# Patient Record
Sex: Female | Born: 1960 | Race: White | Marital: Married | Smoking: Former smoker
Health system: Southern US, Community
[De-identification: ages and names within clinical notes are randomized; demographics above are authoritative.]

## PROBLEM LIST (undated history)

## (undated) DIAGNOSIS — G40909 Epilepsy, unspecified, not intractable, without status epilepticus: Secondary | ICD-10-CM

## (undated) DIAGNOSIS — C55 Malignant neoplasm of uterus, part unspecified: Secondary | ICD-10-CM

## (undated) DIAGNOSIS — I671 Cerebral aneurysm, nonruptured: Secondary | ICD-10-CM

## (undated) DIAGNOSIS — C8590 Non-Hodgkin lymphoma, unspecified, unspecified site: Secondary | ICD-10-CM

## (undated) DIAGNOSIS — K469 Unspecified abdominal hernia without obstruction or gangrene: Secondary | ICD-10-CM

## (undated) DIAGNOSIS — M810 Age-related osteoporosis without current pathological fracture: Secondary | ICD-10-CM

## (undated) DIAGNOSIS — C50919 Malignant neoplasm of unspecified site of unspecified female breast: Secondary | ICD-10-CM

## (undated) HISTORY — DX: Epilepsy, unspecified, not intractable, without status epilepticus: G40.909

## (undated) HISTORY — DX: Age-related osteoporosis without current pathological fracture: M81.0

## (undated) HISTORY — DX: Non-Hodgkin lymphoma, unspecified, unspecified site: C85.90

## (undated) HISTORY — PX: CEREBRAL ANEURYSM REPAIR: SHX164

## (undated) HISTORY — DX: Malignant neoplasm of unspecified site of unspecified female breast: C50.919

## (undated) HISTORY — PX: ABDOMINAL HERNIA REPAIR: SHX539

## (undated) HISTORY — PX: OTHER SURGICAL HISTORY: SHX169

## (undated) HISTORY — PX: HIP SURGERY: SHX245

## (undated) HISTORY — DX: Malignant neoplasm of uterus, part unspecified: C55

## (undated) HISTORY — DX: Cerebral aneurysm, nonruptured: I67.1

## (undated) HISTORY — DX: Unspecified abdominal hernia without obstruction or gangrene: K46.9

## (undated) HISTORY — PX: BREAST ENHANCEMENT SURGERY: SHX7

---

## 2014-10-19 HISTORY — PX: CRANIOTOMY: SHX93

## 2018-01-19 ENCOUNTER — Encounter: Payer: Self-pay | Admitting: Gastroenterology

## 2018-01-24 ENCOUNTER — Ambulatory Visit (INDEPENDENT_AMBULATORY_CARE_PROVIDER_SITE_OTHER): Payer: BLUE CROSS/BLUE SHIELD | Admitting: Gastroenterology

## 2018-01-24 ENCOUNTER — Encounter: Payer: Self-pay | Admitting: Gastroenterology

## 2018-01-24 ENCOUNTER — Other Ambulatory Visit (INDEPENDENT_AMBULATORY_CARE_PROVIDER_SITE_OTHER): Payer: BLUE CROSS/BLUE SHIELD

## 2018-01-24 VITALS — Ht 64.5 in | Wt 174.4 lb

## 2018-01-24 DIAGNOSIS — R634 Abnormal weight loss: Secondary | ICD-10-CM

## 2018-01-24 DIAGNOSIS — R6881 Early satiety: Secondary | ICD-10-CM

## 2018-01-24 DIAGNOSIS — R197 Diarrhea, unspecified: Secondary | ICD-10-CM

## 2018-01-24 LAB — CBC WITH DIFFERENTIAL/PLATELET
Basophils Absolute: 0 10*3/uL (ref 0.0–0.1)
Basophils Relative: 0.4 % (ref 0.0–3.0)
EOS PCT: 1.8 % (ref 0.0–5.0)
Eosinophils Absolute: 0.1 10*3/uL (ref 0.0–0.7)
HCT: 39 % (ref 36.0–46.0)
Hemoglobin: 13 g/dL (ref 12.0–15.0)
LYMPHS ABS: 1 10*3/uL (ref 0.7–4.0)
Lymphocytes Relative: 20.6 % (ref 12.0–46.0)
MCHC: 33.4 g/dL (ref 30.0–36.0)
MCV: 90.8 fl (ref 78.0–100.0)
MONOS PCT: 10.3 % (ref 3.0–12.0)
Monocytes Absolute: 0.5 10*3/uL (ref 0.1–1.0)
NEUTROS ABS: 3.1 10*3/uL (ref 1.4–7.7)
NEUTROS PCT: 66.9 % (ref 43.0–77.0)
PLATELETS: 242 10*3/uL (ref 150.0–400.0)
RBC: 4.3 Mil/uL (ref 3.87–5.11)
RDW: 14.2 % (ref 11.5–15.5)
WBC: 4.6 10*3/uL (ref 4.0–10.5)

## 2018-01-24 LAB — COMPREHENSIVE METABOLIC PANEL
ALK PHOS: 83 U/L (ref 39–117)
ALT: 15 U/L (ref 0–35)
AST: 18 U/L (ref 0–37)
Albumin: 4.2 g/dL (ref 3.5–5.2)
BILIRUBIN TOTAL: 0.5 mg/dL (ref 0.2–1.2)
BUN: 21 mg/dL (ref 6–23)
CO2: 23 mEq/L (ref 19–32)
Calcium: 9.1 mg/dL (ref 8.4–10.5)
Chloride: 106 mEq/L (ref 96–112)
Creatinine, Ser: 0.65 mg/dL (ref 0.40–1.20)
GFR: 100.45 mL/min (ref >60.00)
GLUCOSE: 88 mg/dL (ref 70–99)
POTASSIUM: 4.3 meq/L (ref 3.5–5.1)
SODIUM: 138 meq/L (ref 135–145)
TOTAL PROTEIN: 7.5 g/dL (ref 6.0–8.3)

## 2018-01-24 NOTE — Patient Instructions (Addendum)
We will get records sent from your previous gastroenterologist in [place] for review.  This will include any endoscopic (colonoscopy or upper endoscopy) procedures and any associated pathology reports.   Please stop the linzess for now.  Can resume it every other day if constipation returns. You will be set up for an upper endoscopy for early satiety, weight loss. You will have labs checked today in the basement lab.  Please head down after you check out with the front desk  (cbc, cmet). Normal BMI (Body Mass Index- based on height and weight) is between 23 and 30. Your BMI today is Body mass index is 29.47 kg/m. Marland Kitchen Please consider follow up  regarding your BMI with your Primary Care Provider.

## 2018-01-24 NOTE — Progress Notes (Signed)
HPI: This is a  very pleasant 57 year old woman who was self referred.  Chief complaint is early satiety, intermittent explosive diarrhea, weight loss    She has brain cancer, GBM Stage IV, diagnosed in 2016. S/p surgery then chemo/XRT for about a year. She knows it is not cancer free.  Last chemo/XRT was about a year ago.  She is followed at [facility], MRI brain every other month.  She is having problems with her stomach;  She is eating poorly, early satiety. Feels bloated.  Losing weight 18 pounds in 2 months.  No post prandial pains, but a lot of bloating after eating.  Diarrhea in the AM, this is explosive.  Has been on linzess for about a year.  Used to be on miralax and stool softner as well.   Fiber supplements didn't help, previously.  When she was 17 she had bleeding ulcers.  She takes baby ASA (right side brain stents), was on plavix for 6 months previously.   Old Data Reviewed: Colonoscopy, Dr. Elenor Legato, April 2018 in [place].  Indication "anemia, history of colon polyps".  Findings hemorrhoids, diverticulosis in the sigmoid, 2 diminutive polyps were biopsied at 35 cm.  We do not have any pathology results at the time of this visit   Review of systems: Pertinent positive and negative review of systems were noted in the above HPI section. All other review negative.    History reviewed. No pertinent surgical history.  Current Outpatient Medications  Medication Sig Dispense Refill  . ALPRAZolam (XANAX) 1 MG tablet Take 1 mg by mouth at bedtime as needed for anxiety.    Marland Kitchen aspirin EC 81 MG tablet Take 81 mg by mouth daily.    . baclofen (LIORESAL) 10 MG tablet Take 10 mg by mouth 2 (two) times daily as needed for muscle spasms.    . butalbital-acetaminophen-caffeine (FIORICET WITH CODEINE) 50-325-40-30 MG capsule Take 1 capsule by mouth every 4 (four) hours as needed for headache.    . calcium carbonate (OSCAL) 1500 (600 Ca) MG TABS tablet Take by mouth daily.    Marland Kitchen  levETIRAcetam (KEPPRA) 1000 MG tablet Take 1,000 mg by mouth 2 (two) times daily.    Marland Kitchen linaclotide (LINZESS) 290 MCG CAPS capsule Take 290 mcg by mouth daily before breakfast.    . oxycodone (OXY-IR) 5 MG capsule Take 5 mg by mouth 2 (two) times daily.    Marland Kitchen topiramate (TOPAMAX) 25 MG capsule Take 25 mg by mouth 4 (four) times daily.    . traZODone (DESYREL) 50 MG tablet Take 100 mg by mouth at bedtime.     No current facility-administered medications for this visit.     Allergies as of 01/24/2018  . (No Known Allergies)    Family History  Problem Relation Age of Onset  . Colon cancer Father   . Prostate cancer Father   . Diabetes Father   . Lung cancer Maternal Grandmother   . Cancer Maternal Grandfather   . Cancer Paternal Grandfather     Social History   Socioeconomic History  . Marital status: Married    Spouse name: Not on file  . Number of children: 0  . Years of education: Not on file  . Highest education level: Not on file  Occupational History  . Occupation: Disabled  Social Needs  . Financial resource strain: Not on file  . Food insecurity:    Worry: Not on file    Inability: Not on file  . Transportation needs:  Medical: Not on file    Non-medical: Not on file  Tobacco Use  . Smoking status: Former Smoker    Last attempt to quit: 2006    Years since quitting: 13.2  . Smokeless tobacco: Never Used  Substance and Sexual Activity  . Alcohol use: Yes    Alcohol/week: 1.2 oz    Types: 1 Glasses of wine, 1 Shots of liquor per week    Comment: every evening  . Drug use: Never  . Sexual activity: Not on file  Lifestyle  . Physical activity:    Days per week: Not on file    Minutes per session: Not on file  . Stress: Not on file  Relationships  . Social connections:    Talks on phone: Not on file    Gets together: Not on file    Attends religious service: Not on file    Active member of club or organization: Not on file    Attends meetings of clubs  or organizations: Not on file    Relationship status: Not on file  . Intimate partner violence:    Fear of current or ex partner: Not on file    Emotionally abused: Not on file    Physically abused: Not on file    Forced sexual activity: Not on file  Other Topics Concern  . Not on file  Social History Narrative  . Not on file     Physical Exam: Ht 5' 4.5" (1.638 m)   Wt 174 lb 6 oz (79.1 kg)   BMI 29.47 kg/m  Constitutional: generally well-appearing, she slurs her words somewhat Psychiatric: alert and oriented x3 Eyes: extraocular movements intact Mouth: oral pharynx moist, no lesions Neck: supple no lymphadenopathy Cardiovascular: heart regular rate and rhythm Lungs: clear to auscultation bilaterally Abdomen: soft, nontender, nondistended, no obvious ascites, no peritoneal signs, normal bowel sounds Extremities: no lower extremity edema bilaterally Skin: no lesions on visible extremities   Assessment and plan: 58 y.o. female with personal history of uterine cancer, personal history of neck lymphoma, personal history of stage IV brain cancer, early satiety, weight loss  First she is having explosive diarrhea every morning after she wakes up and eats her breakfast meal.  She is taking Linzess on a daily basis.  She used to require quite a bit more to get her bowels moving when she was very constipated around the time of some chemotherapy treatments.  I recommended she stop the Linzess and see if she even needs that anymore.  Perhaps she will just need on an every other day or as needed basis only.   Second she is  Losing weight and having early satiety with bloating after meals.  I am going to get a basic set of labs checked today including a CBC and complete metabolic profile and we will arrange for an upper endoscopy to be performed at her soonest convenience    Please see the "Patient Instructions" section for addition details about the plan.   Owens Loffler, MD [facility] [place] 01/24/2018, 9:10 AM

## 2018-01-26 ENCOUNTER — Telehealth: Payer: Self-pay | Admitting: Gastroenterology

## 2018-01-26 NOTE — Telephone Encounter (Signed)
Colonoscopy, April 2018, Dr. Karleen Hampshire in [place].  Indication "anemia" also "history of colon polyps" findings hemorrhoids, diverticulosis, colon polyps "to 2-3 mm polyps biopsied 35 cm" pathology showed hyperplastic polyps   Colonoscopy, Dr. Earley Brooke, March 2014, indications "family history of colonic neoplasm".  Findings hemorrhoids, tortuous colon, colon polyps "2 papular polyps removed from the rectum using a cold biopsy forceps", adherent colon.  Pathology, hyperplastic polyps of the rectum.

## 2018-01-28 ENCOUNTER — Encounter: Payer: Self-pay | Admitting: Gastroenterology

## 2018-01-28 ENCOUNTER — Ambulatory Visit (AMBULATORY_SURGERY_CENTER): Payer: BLUE CROSS/BLUE SHIELD | Admitting: Gastroenterology

## 2018-01-28 ENCOUNTER — Other Ambulatory Visit: Payer: Self-pay

## 2018-01-28 VITALS — BP 123/72 | HR 60 | Temp 97.7°F | Resp 15 | Ht 64.5 in | Wt 174.0 lb

## 2018-01-28 DIAGNOSIS — R6881 Early satiety: Secondary | ICD-10-CM

## 2018-01-28 DIAGNOSIS — K2950 Unspecified chronic gastritis without bleeding: Secondary | ICD-10-CM | POA: Diagnosis not present

## 2018-01-28 DIAGNOSIS — K5900 Constipation, unspecified: Secondary | ICD-10-CM

## 2018-01-28 DIAGNOSIS — K2900 Acute gastritis without bleeding: Secondary | ICD-10-CM

## 2018-01-28 MED ORDER — SODIUM CHLORIDE 0.9 % IV SOLN: 500.0000 mL | Freq: Once | INTRAVENOUS | Status: AC

## 2018-01-28 MED ORDER — LINACLOTIDE 72 MCG PO CAPS: 72.0000 ug | ORAL_CAPSULE | Freq: Every day | ORAL | 11 refills | Status: AC

## 2018-01-28 NOTE — Progress Notes (Signed)
Called to room to assist during endoscopic procedure.  Patient ID and intended procedure confirmed with present staff. Received instructions for my participation in the procedure from the performing physician.  

## 2018-01-28 NOTE — Progress Notes (Signed)
Report to PACU, RN, vss, BBS= Clear.  

## 2018-01-28 NOTE — Op Note (Signed)
[facility] Patient Name: Beverly Bates Procedure Date: 01/28/2018 7:49 AM MRN: [id number] Endoscopist: Milus Banister , MD Age: 57 Referring MD:  Date of Birth: [birth year] Gender: Female Account #: [id number] Procedure:                Upper GI endoscopy Indications:              Early satiety, Weight loss; Stage IV GBM Medicines:                Monitored Anesthesia Care Procedure:                Pre-Anesthesia Assessment:                           - Prior to the procedure, a History and Physical                            was performed, and patient medications and                            allergies were reviewed. The patient's tolerance of                            previous anesthesia was also reviewed. The risks                            and benefits of the procedure and the sedation                            options and risks were discussed with the patient.                            All questions were answered, and informed consent                            was obtained. Prior Anticoagulants: The patient has                            taken no previous anticoagulant or antiplatelet                            agents. ASA Grade Assessment: III - A patient with                            severe systemic disease. After reviewing the risks                            and benefits, the patient was deemed in                            satisfactory condition to undergo the procedure.                           After obtaining informed consent, the endoscope was  passed under direct vision. Throughout the                            procedure, the patient's blood pressure, pulse, and                            oxygen saturations were monitored continuously. The                            Endoscope was introduced through the mouth, and                            advanced to the second part of duodenum. The upper                            GI  endoscopy was accomplished without difficulty.                            The patient tolerated the procedure well. Scope In: Scope Out: Findings:                 Mild inflammation characterized by erosions and                            erythema was found in the gastric antrum. Biopsies                            were taken with a cold forceps for histology.                           The exam was otherwise without abnormality. Complications:            No immediate complications. Estimated blood loss:                            None. Estimated Blood Loss:     Estimated blood loss: none. Impression:               - Gastritis. Biopsied to check for H. pylori.                           - The examination was otherwise normal. Recommendation:           - Patient has a contact number available for                            emergencies. The signs and symptoms of potential                            delayed complications were discussed with the                            patient. Return to normal activities tomorrow.                            Written discharge instructions  were provided to the                            patient.                           - Resume previous diet.                           - Continue present medications. Stop your current                            linzess and start lower dose pill (new script today                            20mcg, one pill once daily, disp 30 with 11                            refills).                           - Await pathology results. Will treat with                            appropriate antibiotics if H. pylori is found.                           - Your pain medicines may contribute to some of                            your GI symptoms. Milus Banister, MD 01/28/2018 8:05:07 AM This report has been signed electronically.

## 2018-01-28 NOTE — Patient Instructions (Signed)
Impression/recommendations:  Gastritis (handout given)  YOU HAD AN ENDOSCOPIC PROCEDURE TODAY AT [facility]:   Refer to the procedure report that was given to you for any specific questions about what was found during the examination.  If the procedure report does not answer your questions, please call your gastroenterologist to clarify.  If you requested that your care partner not be given the details of your procedure findings, then the procedure report has been included in a sealed envelope for you to review at your convenience later.  YOU SHOULD EXPECT: Some feelings of bloating in the abdomen. Passage of more gas than usual.  Walking can help get rid of the air that was put into your GI tract during the procedure and reduce the bloating. If you had a lower endoscopy (such as a colonoscopy or flexible sigmoidoscopy) you may notice spotting of blood in your stool or on the toilet paper. If you underwent a bowel prep for your procedure, you may not have a normal bowel movement for a few days.  Please Note:  You might notice some irritation and congestion in your nose or some drainage.  This is from the oxygen used during your procedure.  There is no need for concern and it should clear up in a day or so.  SYMPTOMS TO REPORT IMMEDIATELY:   Following upper endoscopy (EGD)  Vomiting of blood or coffee ground material  New chest pain or pain under the shoulder blades  Painful or persistently difficult swallowing  New shortness of breath  Fever of 100F or higher  Black, tarry-looking stools  For urgent or emergent issues, a gastroenterologist can be reached at any hour by calling [phone].   DIET:  We do recommend a small meal at first, but then you may proceed to your regular diet.  Drink plenty of fluids but you should avoid alcoholic beverages for 24 hours.  ACTIVITY:  You should plan to take it easy for the rest of today and you should NOT DRIVE or use heavy  machinery until tomorrow (because of the sedation medicines used during the test).    FOLLOW UP: Our staff will call the number listed on your records the next business day following your procedure to check on you and address any questions or concerns that you may have regarding the information given to you following your procedure. If we do not reach you, we will leave a message.  However, if you are feeling well and you are not experiencing any problems, there is no need to return our call.  We will assume that you have returned to your regular daily activities without incident.  If any biopsies were taken you will be contacted by phone or by letter within the next 1-3 weeks.  Please call us at [phone] if you have not heard about the biopsies in 3 weeks.    SIGNATURES/CONFIDENTIALITY: You and/or your care partner have signed paperwork which will be entered into your electronic medical record.  These signatures attest to the fact that that the information above on your After Visit Summary has been reviewed and is understood.  Full responsibility of the confidentiality of this discharge information lies with you and/or your care-partner.

## 2018-01-31 ENCOUNTER — Telehealth: Payer: Self-pay

## 2018-01-31 NOTE — Telephone Encounter (Signed)
  Follow up Call-  Call back number 01/28/2018  Post procedure Call Back phone  # [phone]  Permission to leave phone message Yes     No answer.  No ID on voicemail.  No message left.  Will try again this afternoon. Reagan Klemz/Call-back

## 2018-01-31 NOTE — Telephone Encounter (Signed)
  Follow up Call-  Call back number 01/28/2018  Post procedure Call Back phone  # [phone]  Permission to leave phone message Yes     Patient questions:  Do you have a fever, pain , or abdominal swelling? No. Pain Score  0 *  Have you tolerated food without any problems? Yes.    Have you been able to return to your normal activities? Yes.    Do you have any questions about your discharge instructions: Diet   No. Medications  No. Follow up visit  No.  Do you have questions or concerns about your Care? No.  Actions: * If pain score is 4 or above: No action needed, pain <4.  No problems noted per pt. maw

## 2018-02-03 ENCOUNTER — Encounter: Payer: Self-pay | Admitting: Gastroenterology

## 2018-02-16 ENCOUNTER — Telehealth: Payer: Self-pay | Admitting: Gastroenterology

## 2018-02-16 NOTE — Telephone Encounter (Signed)
Please call the patient. The biopsies from her stomach show no sign of infection or cancer. Should continue with the suggestions outlined at recent visit.  I would also like her to have a CT scan abd/pelvis for her weight loss, early satiety. If negative, will likely blame her known stage IV brain cancer (GBM) and no further GI testing would be necessary.    Out of town for month of May will call when she is back in town

## 2018-03-03 NOTE — Telephone Encounter (Signed)
5/16 Left message on machine to call back

## 2018-03-03 NOTE — Telephone Encounter (Signed)
Patient is asking if we can schedule CT for the week of Memoral day beginning the 28th of May. Best call back # [phone].

## 2018-03-04 NOTE — Telephone Encounter (Signed)
Left message on machine to call back  

## 2018-03-07 NOTE — Telephone Encounter (Signed)
Left message on machine to call back  

## 2018-03-08 ENCOUNTER — Other Ambulatory Visit: Payer: Self-pay

## 2018-03-08 DIAGNOSIS — R6881 Early satiety: Secondary | ICD-10-CM

## 2018-03-08 DIAGNOSIS — R634 Abnormal weight loss: Secondary | ICD-10-CM

## 2018-03-08 NOTE — Telephone Encounter (Signed)
The pt has been advised and will pick up contrast and instructions next week.

## 2018-03-08 NOTE — Telephone Encounter (Signed)
You have been scheduled for a CT scan of the abdomen and pelvis at [facility] ([address]---this is in the same building as Press [facility]).   You are scheduled on 03/18/18 at 2 pm. You should arrive 15 minutes prior to your appointment time for registration. Please follow the written instructions below on the day of your exam:  WARNING: IF YOU ARE ALLERGIC TO IODINE/X-RAY DYE, PLEASE NOTIFY RADIOLOGY IMMEDIATELY AT [phone]! YOU WILL BE GIVEN A 13 HOUR PREMEDICATION PREP.  1) Do not eat or drink anything after 10 am (4 hours prior to your test) 2) You have been given 2 bottles of oral contrast to drink. The solution may taste better if refrigerated, but do NOT add ice or any other liquid to this solution. Shake well before drinking.    Drink 1 bottle of contrast @ 12 noon (2 hours prior to your exam)  Drink 1 bottle of contrast @ 1 pm (1 hour prior to your exam)  You may take any medications as prescribed with a small amount of water except for the following: Metformin, Glucophage, Glucovance, Avandamet, Riomet, Fortamet, Actoplus Met, Janumet, Glumetza or Metaglip. The above medications must be held the day of the exam AND 48 hours after the exam.  The purpose of you drinking the oral contrast is to aid in the visualization of your intestinal tract. The contrast solution may cause some diarrhea. Before your exam is started, you will be given a small amount of fluid to drink. Depending on your individual set of symptoms, you may also receive an intravenous injection of x-ray contrast/dye. Plan on being at [facility] for 30 minutes or longer, depending on the type of exam you are having performed.  This test typically takes 30-45 minutes to complete.  If you have any questions regarding your exam or if you need to reschedule, you may call the CT department at [phone] between the hours of 8:00 am and 5:00 pm,  Monday-Friday.  ________________________________________________________________________

## 2018-03-18 ENCOUNTER — Encounter: Payer: Self-pay | Admitting: Radiology

## 2018-03-18 ENCOUNTER — Ambulatory Visit (INDEPENDENT_AMBULATORY_CARE_PROVIDER_SITE_OTHER)
Admission: RE | Admit: 2018-03-18 | Discharge: 2018-03-18 | Disposition: A | Discharge status: Home / Self Care | Payer: BLUE CROSS/BLUE SHIELD | Source: Ambulatory Visit | Attending: Gastroenterology | Admitting: Gastroenterology

## 2018-03-18 DIAGNOSIS — R634 Abnormal weight loss: Secondary | ICD-10-CM | POA: Diagnosis not present

## 2018-03-18 DIAGNOSIS — R6881 Early satiety: Secondary | ICD-10-CM

## 2018-03-18 MED ORDER — IOPAMIDOL (ISOVUE-300) INJECTION 61%
100.0000 mL | Freq: Once | INTRAVENOUS | Status: AC | PRN
  Administered 2018-03-18: 100 mL via INTRAVENOUS

## 2018-03-21 ENCOUNTER — Telehealth: Payer: Self-pay | Admitting: Gastroenterology

## 2018-03-21 NOTE — Telephone Encounter (Signed)
The pt has been advised that the results have not been reviewed as of today.  I will call her as soon as reviewed.

## 2018-03-22 ENCOUNTER — Other Ambulatory Visit: Payer: Self-pay

## 2018-03-22 DIAGNOSIS — R6881 Early satiety: Secondary | ICD-10-CM

## 2018-03-22 DIAGNOSIS — R634 Abnormal weight loss: Secondary | ICD-10-CM

## 2019-08-20 IMAGING — CT CT ABD-PELV W/ CM
2 of 5 series · 16 of 46 positions shown, 18 images · IV contrast (ISOVUE 300)
Comparison: None.

CLINICAL DATA: Patient with weight loss and early satiety.

EXAM:
CT ABDOMEN AND PELVIS WITH CONTRAST
TECHNIQUE: Multidetector CT imaging of the abdomen and pelvis was performed
using the standard protocol following bolus administration of
intravenous contrast.
CONTRAST:  100mL [id number] IOPAMIDOL ([id number]) INJECTION 61%

[Series 2: abd/pel w · axial · 0.74mm/px · z∈[-588,-212]mm · 13 of 85 slices shown, 15 images]
[im 5/85  soft-tissue]
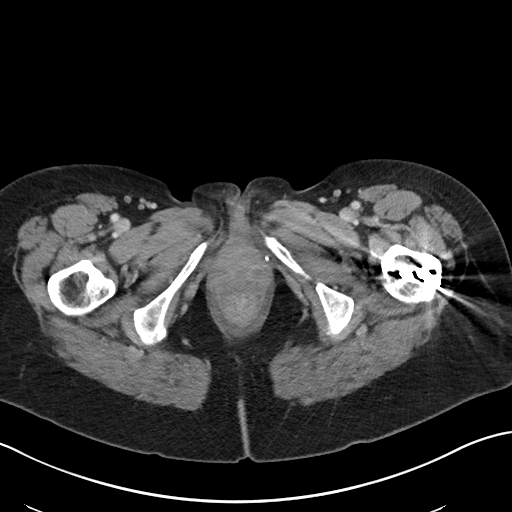
[im 5/85  bone]
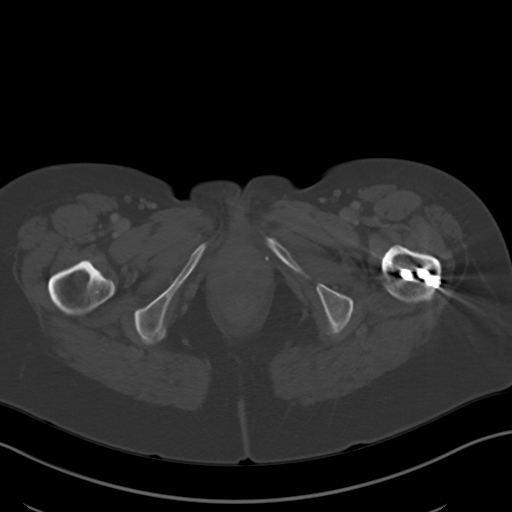
[im 14/85  soft-tissue]
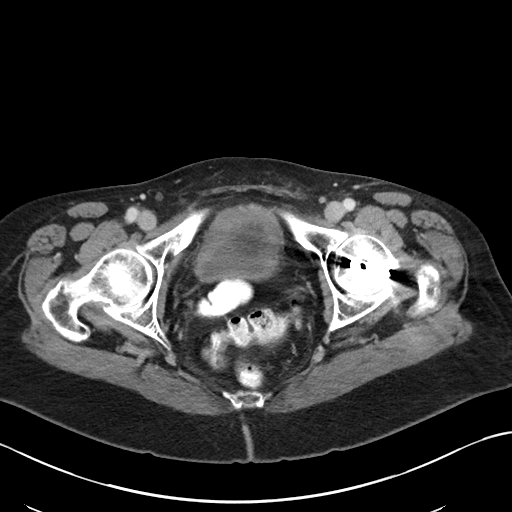
[im 18/85  soft-tissue]
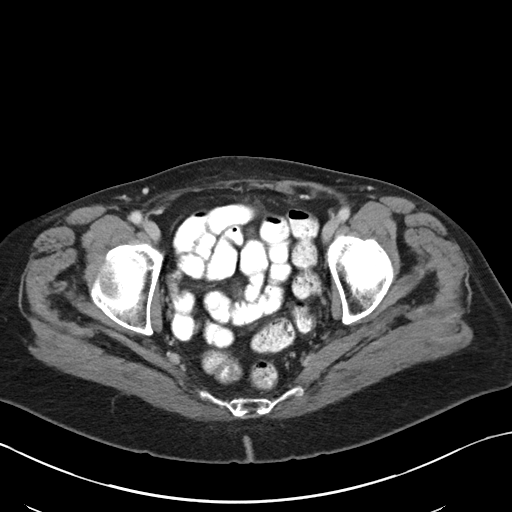
[im 23/85  soft-tissue]
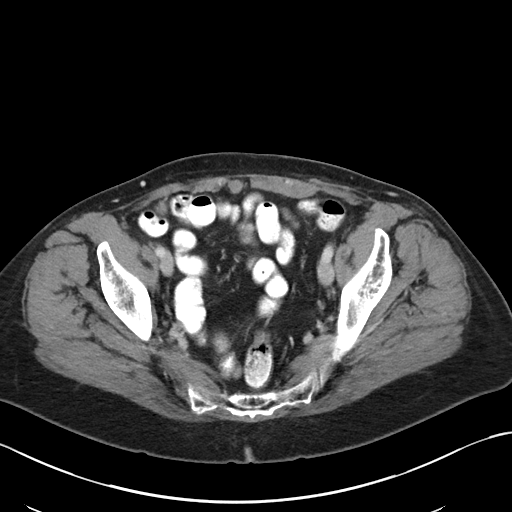
[im 31/85  soft-tissue]
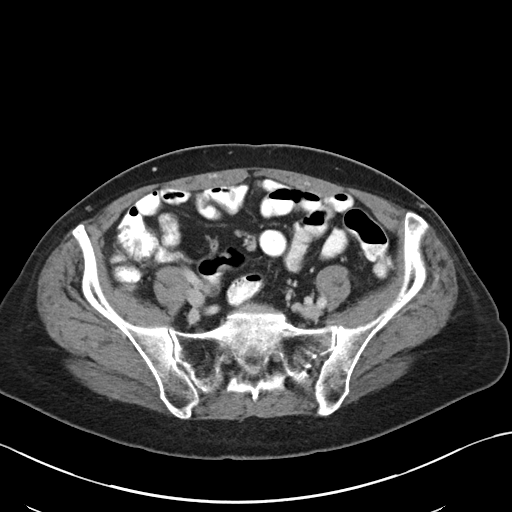
[im 36/85  soft-tissue]
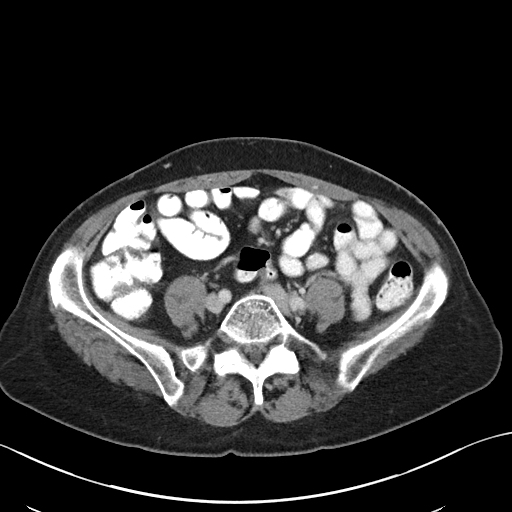
[im 45/85  soft-tissue]
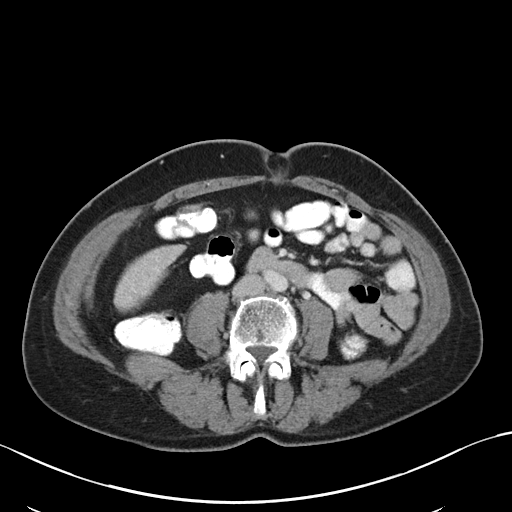
[im 49/85  soft-tissue]
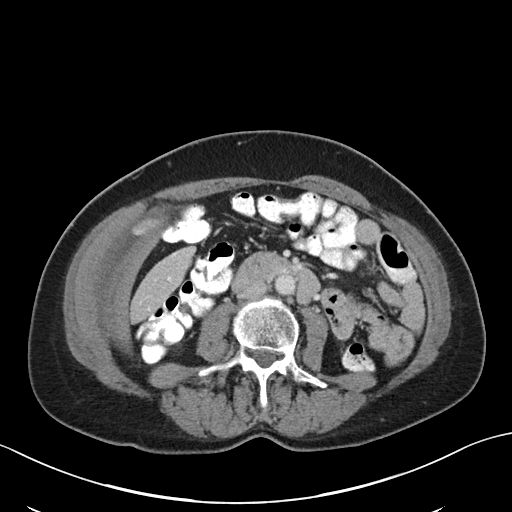
[im 54/85  soft-tissue]
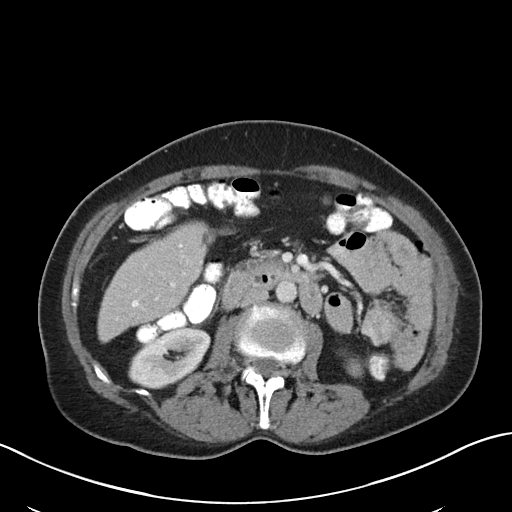
[im 54/85  bone]
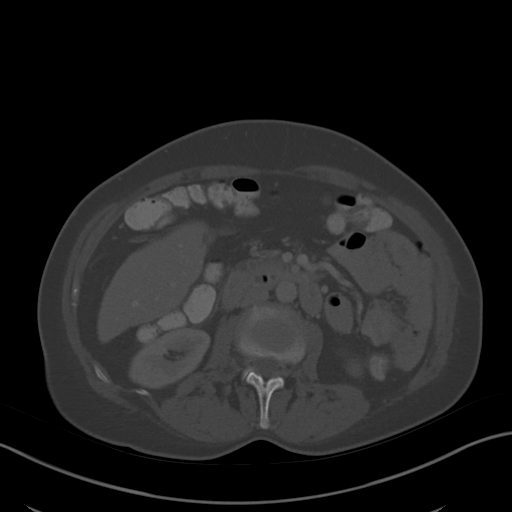
[im 62/85  soft-tissue]
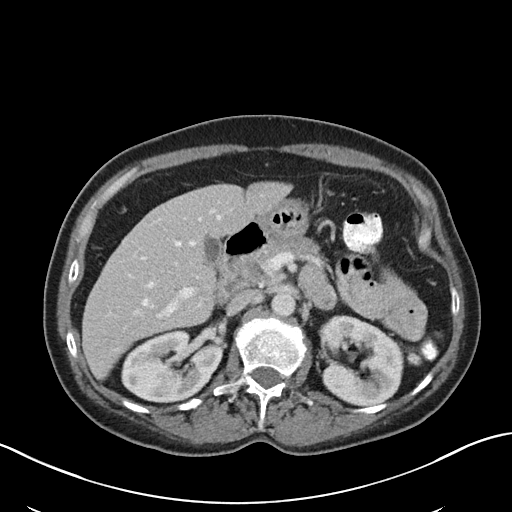
[im 67/85  soft-tissue]
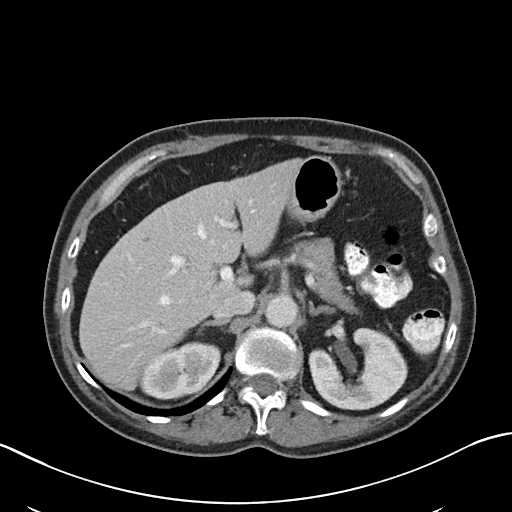
[im 71/85  soft-tissue]
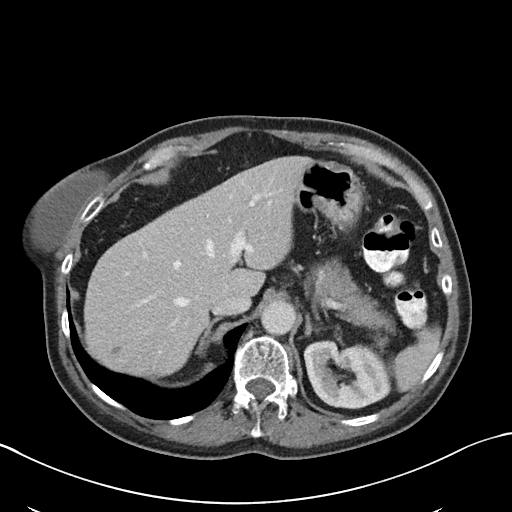
[im 80/85  soft-tissue]
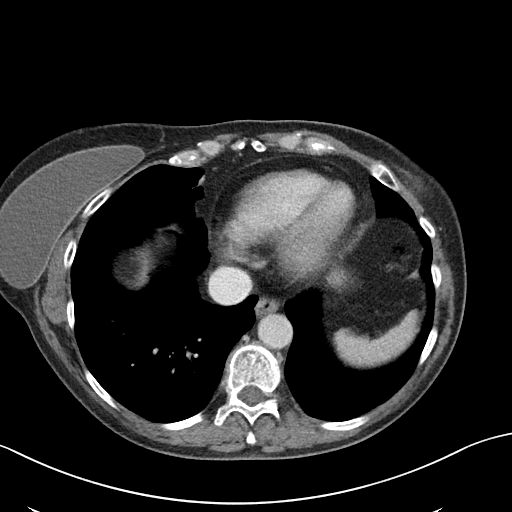

[Series 6: abd/pel w st · coronal · 0.66mm/px · 3 of 78 slices shown]
[im 26/78  soft-tissue]
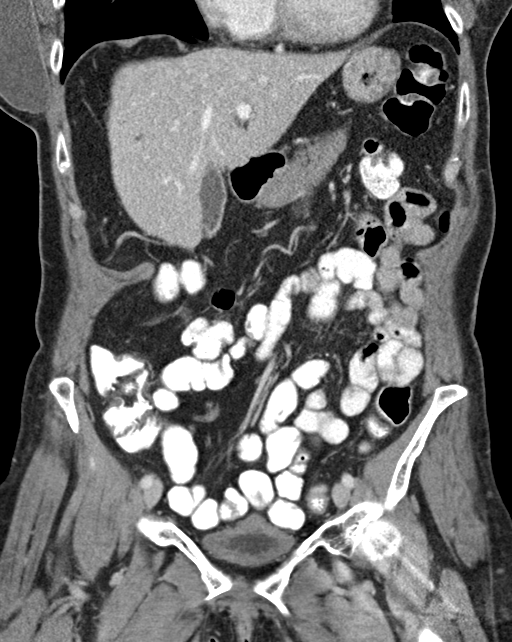
[im 35/78  soft-tissue]
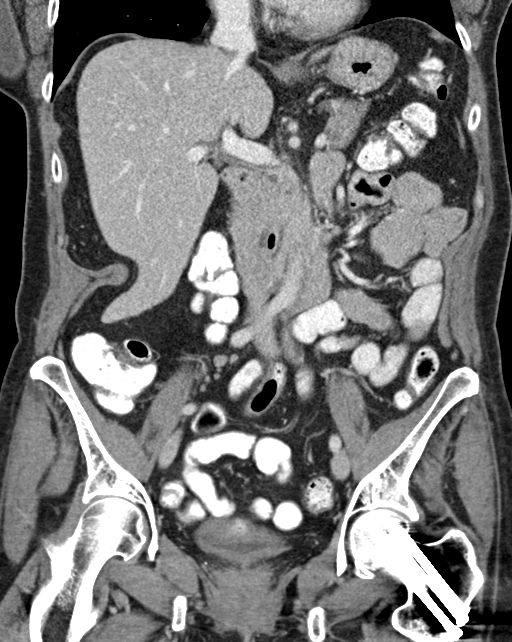
[im 43/78  soft-tissue]
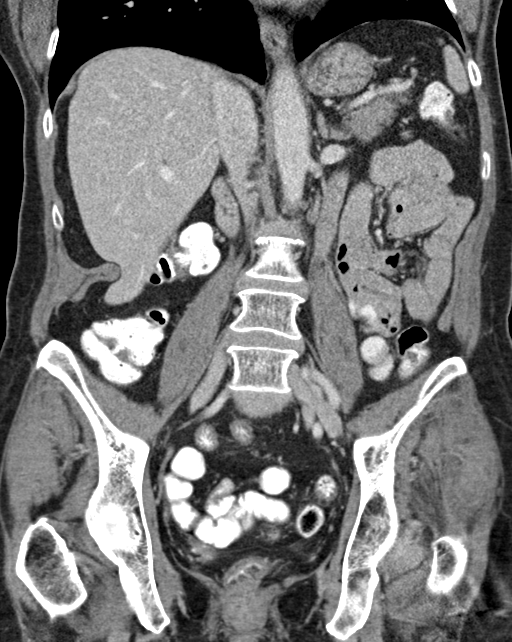

[16 of 46 positions shown; findings below may reference images not displayed]

FINDINGS: Lower chest: Normal heart size. Dependent atelectasis left lower
lobe. No pleural effusion.

Hepatobiliary: The liver is normal in size and contour.
Subcentimeter too small to characterize low-attenuation lesion right
hepatic lobe (image 15; series 2). Additional subcentimeter lesion
demonstrated in the central aspect of the liver (image 19; series
2). Gallbladder is unremarkable. No intrahepatic or extrahepatic
biliary ductal dilatation.

Pancreas: Unremarkable

Spleen: Unremarkable

Adrenals/Urinary Tract: 1.5 cm indeterminate right adrenal nodule
(image 17; series 2). Kidneys enhance symmetrically with contrast.
Urinary bladder is decompressed, limiting evaluation. Punctate 1-2
mm stones within the left and right kidneys. 22

Stomach/Bowel: No abnormal bowel wall thickening or evidence for
bowel obstruction. No free fluid or free intraperitoneal air. Normal
morphology of the stomach.

Vascular/Lymphatic: Normal caliber abdominal aorta. No
retroperitoneal lymphadenopathy.

Reproductive: Status post hysterectomy.

Other: None.

Musculoskeletal: Postsurgical change proximal left femur. Lumbar
spine degenerative changes. No aggressive or acute appearing osseous
lesions.
IMPRESSION: No acute process within the abdomen or pelvis.

## 2022-11-19 DEATH — deceased
